# Patient Record
Sex: Male | Born: 2000 | Race: White | Hispanic: No | Marital: Single | Smoking: Never smoker
Health system: Southern US, Community
[De-identification: ages and names within clinical notes are randomized; demographics above are authoritative.]

---
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
## 2018-01-20 ENCOUNTER — Other Ambulatory Visit: Payer: Self-pay | Admitting: Otolaryngology

## 2018-01-20 ENCOUNTER — Ambulatory Visit
Admission: RE | Admit: 2018-01-20 | Discharge: 2018-01-20 | Disposition: A | Discharge status: Home / Self Care | Payer: BC Managed Care – PPO | Source: Ambulatory Visit | Attending: Otolaryngology | Admitting: Otolaryngology

## 2018-01-20 DIAGNOSIS — J038 Acute tonsillitis due to other specified organisms: Principal | ICD-10-CM

## 2018-01-20 DIAGNOSIS — B9689 Other specified bacterial agents as the cause of diseases classified elsewhere: Secondary | ICD-10-CM

## 2018-01-20 MED ORDER — IOPAMIDOL (ISOVUE-300) INJECTION 61%
75.0000 mL | Freq: Once | INTRAVENOUS | Status: AC | PRN
  Administered 2018-01-20: 75 mL via INTRAVENOUS

## 2020-10-25 ENCOUNTER — Other Ambulatory Visit: Payer: Self-pay

## 2020-10-26 MED ORDER — SERTRALINE HCL 50 MG PO TABS: 50.0000 mg | ORAL_TABLET | Freq: Every day | ORAL | 3 refills | Status: AC
